# Patient Record
Sex: Male | Born: 1980 | Hispanic: Yes | Marital: Married | Smoking: Never smoker
Health system: Southern US, Community
[De-identification: ages and names within clinical notes are randomized; demographics above are authoritative.]

---

## 2013-11-10 ENCOUNTER — Encounter (HOSPITAL_COMMUNITY): Payer: Self-pay | Admitting: Emergency Medicine

## 2013-11-10 ENCOUNTER — Emergency Department (HOSPITAL_COMMUNITY)
Admission: EM | Admit: 2013-11-10 | Discharge: 2013-11-11 | Disposition: A | Discharge status: Home / Self Care | Payer: Worker's Compensation | Attending: Emergency Medicine | Admitting: Emergency Medicine

## 2013-11-10 ENCOUNTER — Emergency Department (HOSPITAL_COMMUNITY): Payer: Worker's Compensation

## 2013-11-10 DIAGNOSIS — Y929 Unspecified place or not applicable: Secondary | ICD-10-CM | POA: Insufficient documentation

## 2013-11-10 DIAGNOSIS — Z792 Long term (current) use of antibiotics: Secondary | ICD-10-CM | POA: Insufficient documentation

## 2013-11-10 DIAGNOSIS — S61209A Unspecified open wound of unspecified finger without damage to nail, initial encounter: Secondary | ICD-10-CM | POA: Insufficient documentation

## 2013-11-10 DIAGNOSIS — Y990 Civilian activity done for income or pay: Secondary | ICD-10-CM | POA: Insufficient documentation

## 2013-11-10 DIAGNOSIS — Z23 Encounter for immunization: Secondary | ICD-10-CM | POA: Insufficient documentation

## 2013-11-10 DIAGNOSIS — Y939 Activity, unspecified: Secondary | ICD-10-CM | POA: Insufficient documentation

## 2013-11-10 DIAGNOSIS — W230XXA Caught, crushed, jammed, or pinched between moving objects, initial encounter: Secondary | ICD-10-CM | POA: Insufficient documentation

## 2013-11-10 DIAGNOSIS — IMO0002 Reserved for concepts with insufficient information to code with codable children: Secondary | ICD-10-CM

## 2013-11-10 NOTE — ED Notes (Signed)
Patient here with complaint of left 3rd and 4th finger injury. 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 Explains that fingers are cut from edge of pipe.

## 2013-11-10 NOTE — ED Provider Notes (Signed)
CSN: [id number]     Arrival date & time 11/10/13  2024 History  This chart was scribed for Alan SilkHannah Laterrance Nauta, PA-C working with No att. providers found by Evon Slackerrance Branch, ED Scribe. This patient was seen in room TR09C/TR09C and the patient's care was started at 8:55 PM.    Chief Complaint  Patient presents with  . Finger Injury   The history is provided by the patient. No language interpreter was used.   HPI Comments: Alan Daugherty is a 33 y.o. right hand dominant male who presents to the Emergency Department complaining of left hand finger injury onset 7:45PM. He has an injury to the 3rd and 4th digit on his left hand. 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 Patient states he is in very little pain. He can move his finger without issues. He denies any other injuries.  He is unsure of last tetanus shot.   History reviewed. No pertinent past medical history. History reviewed. No pertinent past surgical history.   No family history on file. History  Substance Use Topics  . Smoking status: Never Smoker   . Smokeless tobacco: Not on file  . Alcohol Use: Yes     Comment: Occasional     Review of Systems  Skin: Positive for wound.  Neurological: Negative for weakness and numbness.  All other systems reviewed and are negative.     Allergies  Review of patient's allergies indicates no known allergies.  Home Medications   Prior to Admission medications   Medication Sig Start Date End Date Taking? Authorizing Provider  cephALEXin (KEFLEX) 500 MG capsule Take 1 capsule (500 mg total) by mouth 4 (four) times daily. 11/11/13   Mora BellmanHannah S Fitz Matsuo, PA-C   Triage Vitals: BP 119/69  Pulse 99  Temp(Src) 97.9 F (36.6 C) (Oral)  Resp 18  Ht 5\' 11"  (1.803 m)  Wt 223 lb 11.2 oz (101.47 kg)  BMI 31.21 kg/m2  SpO2 98%  Physical Exam  Nursing note and vitals reviewed. Constitutional: He is oriented to person, place, and time. He appears  well-developed and well-nourished. No distress.  HENT:  Head: Normocephalic and atraumatic.  Right Ear: External ear normal.  Left Ear: External ear normal.  Nose: Nose normal.  Eyes: Conjunctivae are normal.  Neck: Normal range of motion. No tracheal deviation present.  Cardiovascular: Normal rate, regular rhythm and normal heart sounds.   Pulmonary/Chest: Effort normal and breath sounds normal. No stridor.  Abdominal: Soft. He exhibits no distension. There is no tenderness.  Musculoskeletal: Normal range of motion.  Left hand: 2cm laceration to distal aspect on 3rd finger. Flexion and extension tested against resistance, strength 5/5 Sensation intact.  Capillary refill < 3 seconds   Neurological: He is alert and oriented to person, place, and time.  Skin: Skin is warm and dry. He is not diaphoretic.  Psychiatric: He has a normal mood and affect. His behavior is normal.    ED Course  Procedures (including critical care time) DIAGNOSTIC STUDIES: Oxygen Saturation is 98% on RA, normal by my interpretation.    COORDINATION OF CARE:  Labs Review Labs Reviewed - No data to display  Imaging Review Dg Hand Complete Left  11/10/2013   CLINICAL DATA:  Left hand injury.  EXAM: LEFT HAND - COMPLETE 3+ VIEW  COMPARISON:  None.  FINDINGS: There is a ring on the fourth finger. A bandage over the distal aspect of the fourth finger. There is  a bandage over the mid and distal aspect of the third finger. Lateral view demonstrates a soft tissue laceration involving the distal aspect of the third finger. Negative for fracture or dislocation.  IMPRESSION: No acute bone abnormality to the left hand.  Soft tissue injuries involving the third and fourth digits.   Electronically Signed   By: Richarda OverlieAdam  Henn M.D.   On: 11/10/2013 21:27     EKG Interpretation None      LACERATION REPAIR Performed by: Alan SilkMerrell, [name] Authorized by: Alan SilkMerrell, [name] Consent: Verbal consent obtained. Risks and benefits:  risks, benefits and alternatives were discussed Consent given by: patient Patient identity confirmed: provided demographic data Prepped and Draped in normal sterile fashion Wound explored  Laceration Location: 3rd finger  Laceration Length: 2 cm  No Foreign Bodies seen or palpated  Anesthesia: digital block  Local anesthetic: lidocaine 2%   Anesthetic total: 3 ml  Irrigation method: syringe Amount of cleaning: standard  Skin closure: 5-0 Ethilon  Number of sutures: 4  Technique: simple interrupted   Patient tolerance: Patient tolerated the procedure well with no immediate complications.   MDM   Final diagnoses:  Laceration    Tdap booster given. Wound cleaning complete with pressure irrigation, bottom of wound visualized, no foreign bodies appreciated. Laceration occurred < 8 hours prior to repair which was well tolerated. Pt has no co morbidities to effect normal wound healing. Discussed suture home care w pt and answered questions. Pt to f-u for wound check and suture removal in 7 days. Pt is hemodynamically stable w no complaints prior to dc.   I personally performed the services described in this documentation, which was scribed in my presence. The recorded information has been reviewed and is accurate.    Mora BellmanHannah S [name], PA-C 11/12/13 1538

## 2013-11-10 NOTE — ED Notes (Signed)
Patient transported to X-ray 

## 2013-11-11 MED ORDER — CEPHALEXIN 500 MG PO CAPS: 500.0000 mg | ORAL_CAPSULE | Freq: Four times a day (QID) | ORAL | Status: DC

## 2013-11-11 MED ORDER — TETANUS-DIPHTH-ACELL PERTUSSIS 5-2.5-18.5 LF-MCG/0.5 IM SUSP
0.5000 mL | Freq: Once | INTRAMUSCULAR | Status: AC
  Administered 2013-11-11: 0.5 mL via INTRAMUSCULAR
  Filled 2013-11-11: qty 0.5

## 2013-11-11 NOTE — Discharge Instructions (Signed)
Cuidados de una laceración - Adultos  °(Laceration Care, Adult) ° Una laceración es un corte que atraviesa todas las capas de la piel. El corte llega hasta las capas inferiores de la piel.  °CUIDADOS EN EL HOGAR  °Si tiene puntos (suturas) o grapas:  °· Mantenga el corte limpio y seco. °· Si tiene un (vendaje) cámbielo al menos una vez al día. Cámbielo si se moja o se ensucia, o según las indicaciones del médico. °· Lave el corte dos veces por día con agua y jabón. Enjuágelo con agua. Seque dando palmaditas con un paño limpio y seco. °· Aplique una capa delgada de crema con medicamento sobre el corte, según las indicaciones del médico. °· Puede ducharse después de las primeras 24 horas. No remoje la herida en agua hasta que le hayan quitado los puntos. °· Tome sólo los medicamentos que le haya indicado el médico. °· Concurra para que le retiren los puntos cuando el médico le indique. °En caso que tenga tiras adhesivas:  °· Mantenga la herida limpia y seca. °· No deje que las tiras se mojen. Puede tomar un baño, pero tenga cuidado de no mojar el corte. °· Si se moja, séquelo dando palmaditas con una toalla limpia. °· Las tiras caerán por sí mismas. No quite las tiras que están pegadas a la herida. °En caso que le hayan aplicado adhesivo.  °· Puede ducharse o tomar un baño de inmersión. No frote ni sumerja la herida. Nopractique natación. Evite transpirar mucho hasta que el adhesivo desaparezca. Después de ducharse o darse un baño, seque el corte dando palmaditas con una toalla limpia. °· No aplique medicamentos ni maquillaje hasta que el adhesivo caiga. °· Si tiene un vendaje, No peque cinta adhesiva sobre el adhesivo °· Evite la luz solar o las lámparas para bronceado hasta que el adhesivo desaparezca. Aplique pantalla solar sobre el corte durante el primer año, para reducir la cicatriz. °· El adhesivo caerá por sí mismo. No quite el pegamento. °Deberá aplicarse la vacuna contra el tétanos si: °· No recuerda cuándo  se colocó la vacuna la última vez. °· Nunca recibió esta vacuna. °Si usted necesita aplicarse la vacuna y se niega a recibirla, corre riesgo de contraer tétanos. Ésta es una enfermedad grave.  °SOLICITE AYUDA DE INMEDIATO SI:  °· El dolor no mejora con los medicamentos prescriptos. °· Pierde la sensibilidad (adormecimiento) en el brazo, la mano, la pierna o el pie u observa cambios en el color. °· El corte sangra. °· Siente debilidad en la articulación o no puede usarla. °· Tiene bultos que le duelen en el cuerpo. °· La zona del corte está roja, le duele o estáhinchada. °· Hay una línea roja en la piel, cerca del corte. °· Observa un líquido blanco amarillento (pus) en la herida. °· Tiene fiebre. °· Advierte un olor fétido que proviene de la herida o del vendaje. °· La herida se abre antes o después de que le hayan quitado los puntos. °· Nota que en la herida hay algún cuerpo extraño como un trozo de madera o vidrio. °· No puede mover los dedos. °ASEGÚRESE DE QUE:  °· Comprende estas instrucciones. °· Controlará su enfermedad. °· Solicitará ayuda de inmediato si no mejora o si empeora. °Document Released: 12/19/2010 Document Revised: 07/15/2011 °ExitCare® Patient Information ©2015 [employer]. This information is not intended to replace advice given to you by your health care provider. Make sure you discuss any questions you have with your health care provider. ° °

## 2013-11-13 NOTE — ED Provider Notes (Signed)
Medical screening examination/treatment/procedure(s) were performed by non-physician practitioner and as supervising physician I was immediately available for consultation/collaboration.   Ria Redcay L Mckinzi Eriksen, MD 11/13/13 1304 

## 2015-05-05 IMAGING — CR DG HAND COMPLETE 3+V*L*
3 series · 3 of 3 positions shown · non-contrast
Comparison: None.

CLINICAL DATA: Left hand injury.

EXAM:
LEFT HAND - COMPLETE 3+ VIEW

[x hand pa left]
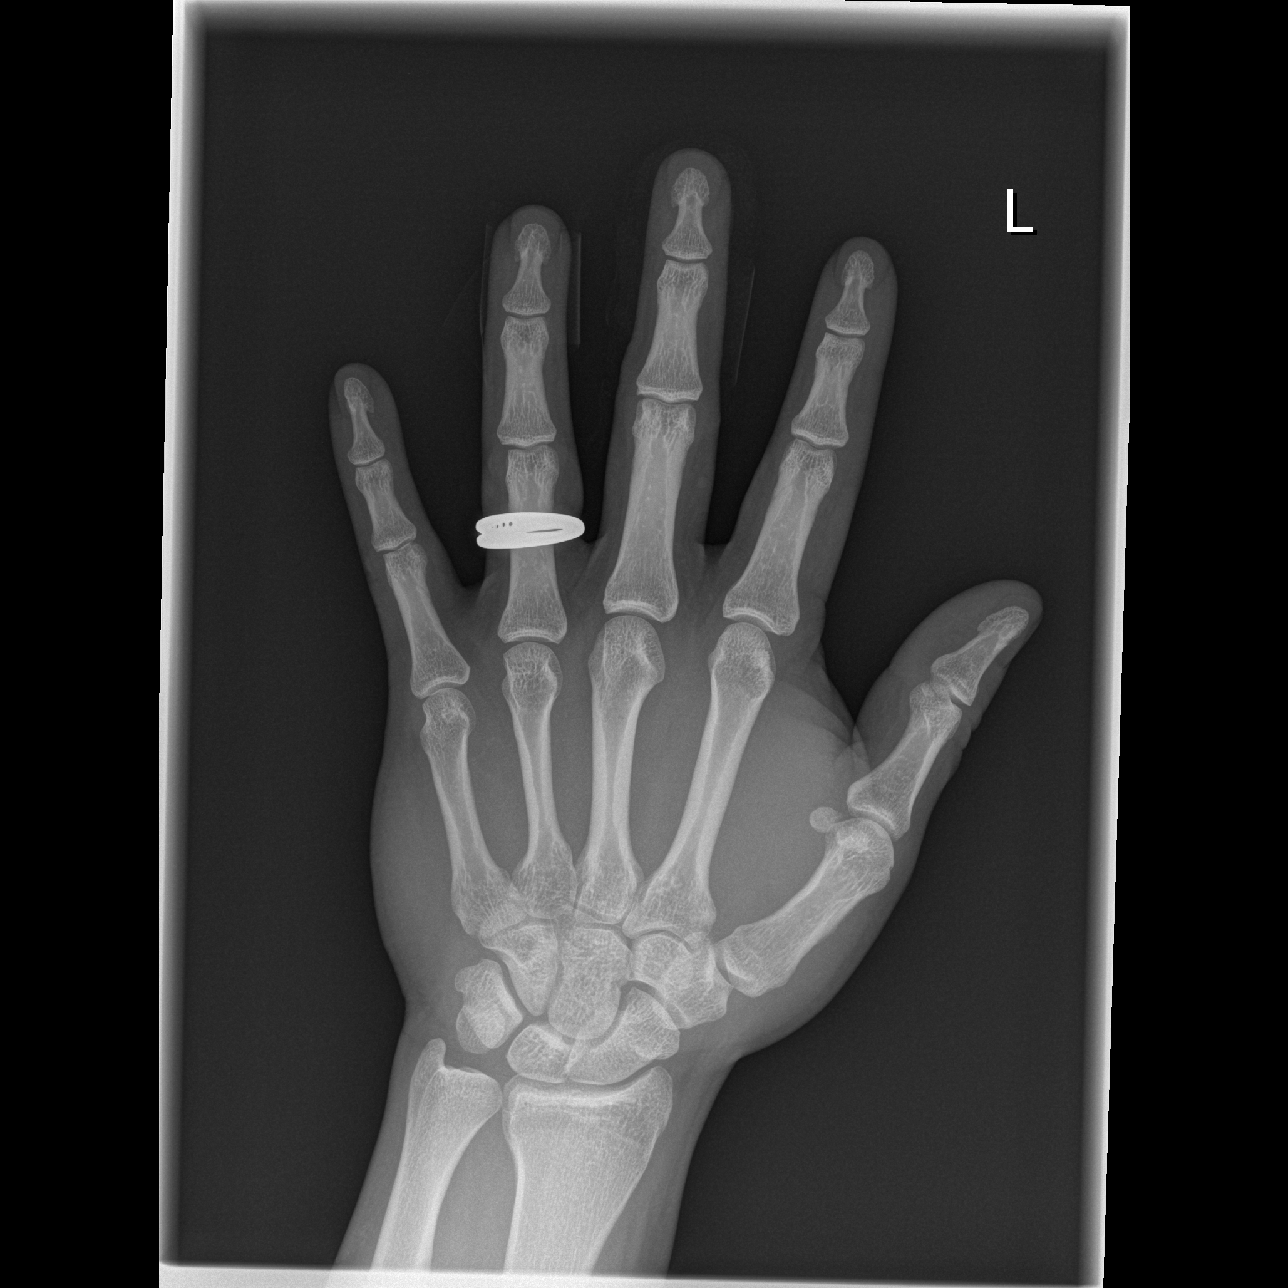

[x hand oblique left]
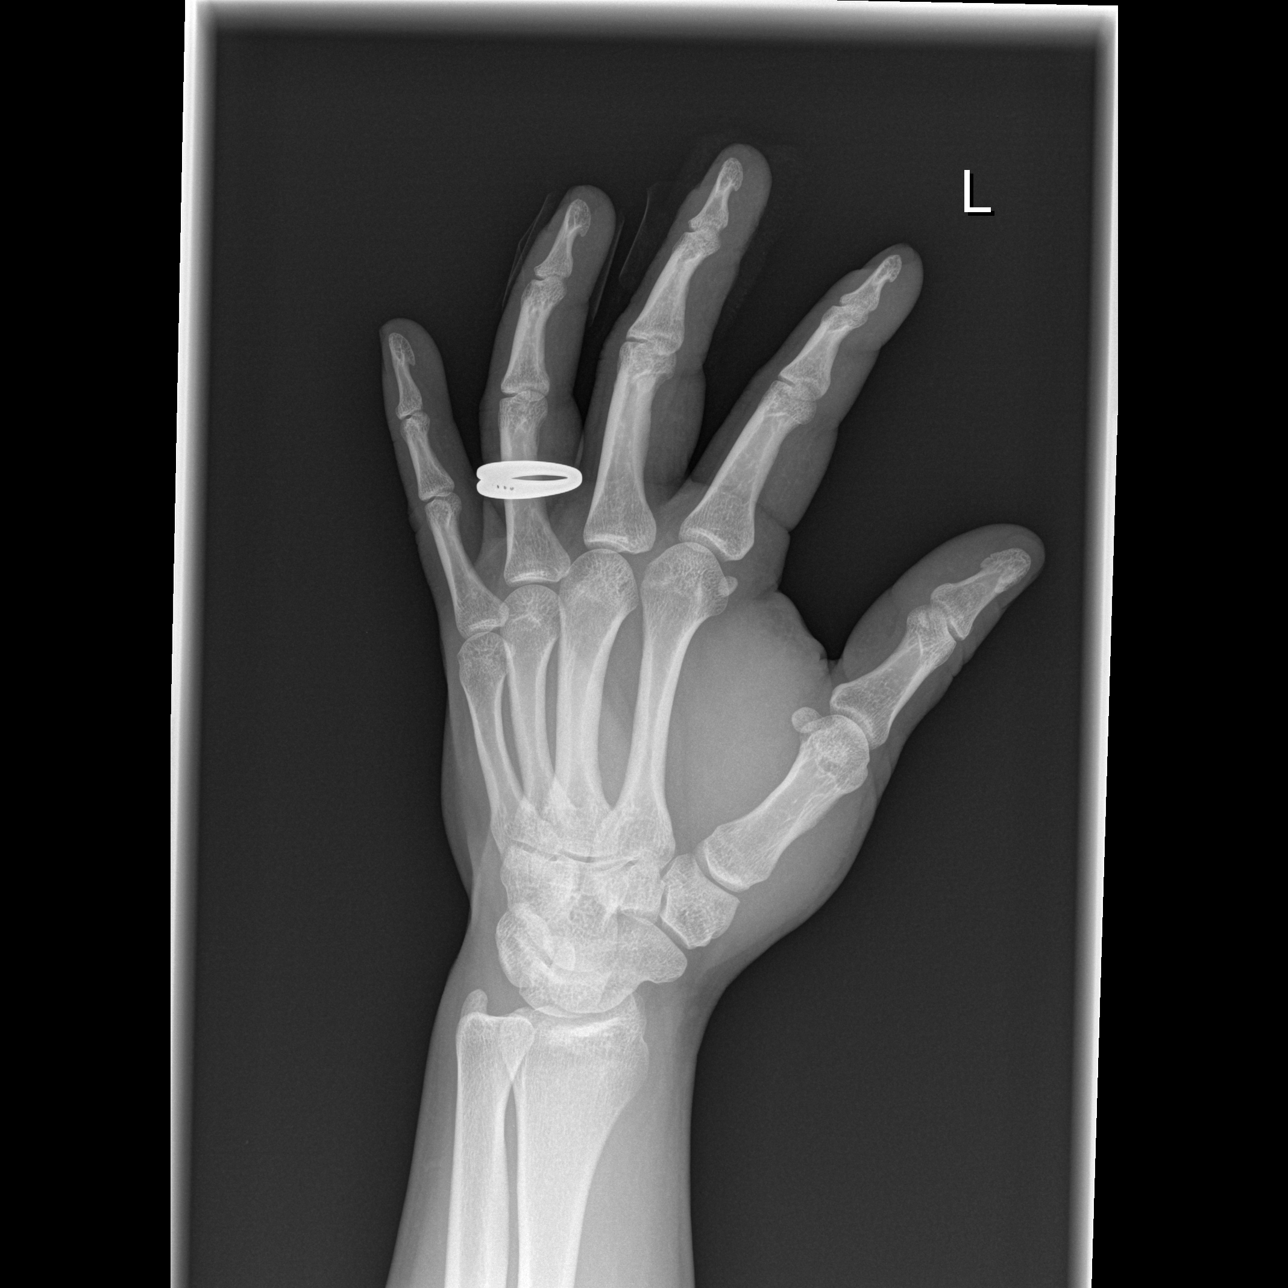

[x hand lat left *]
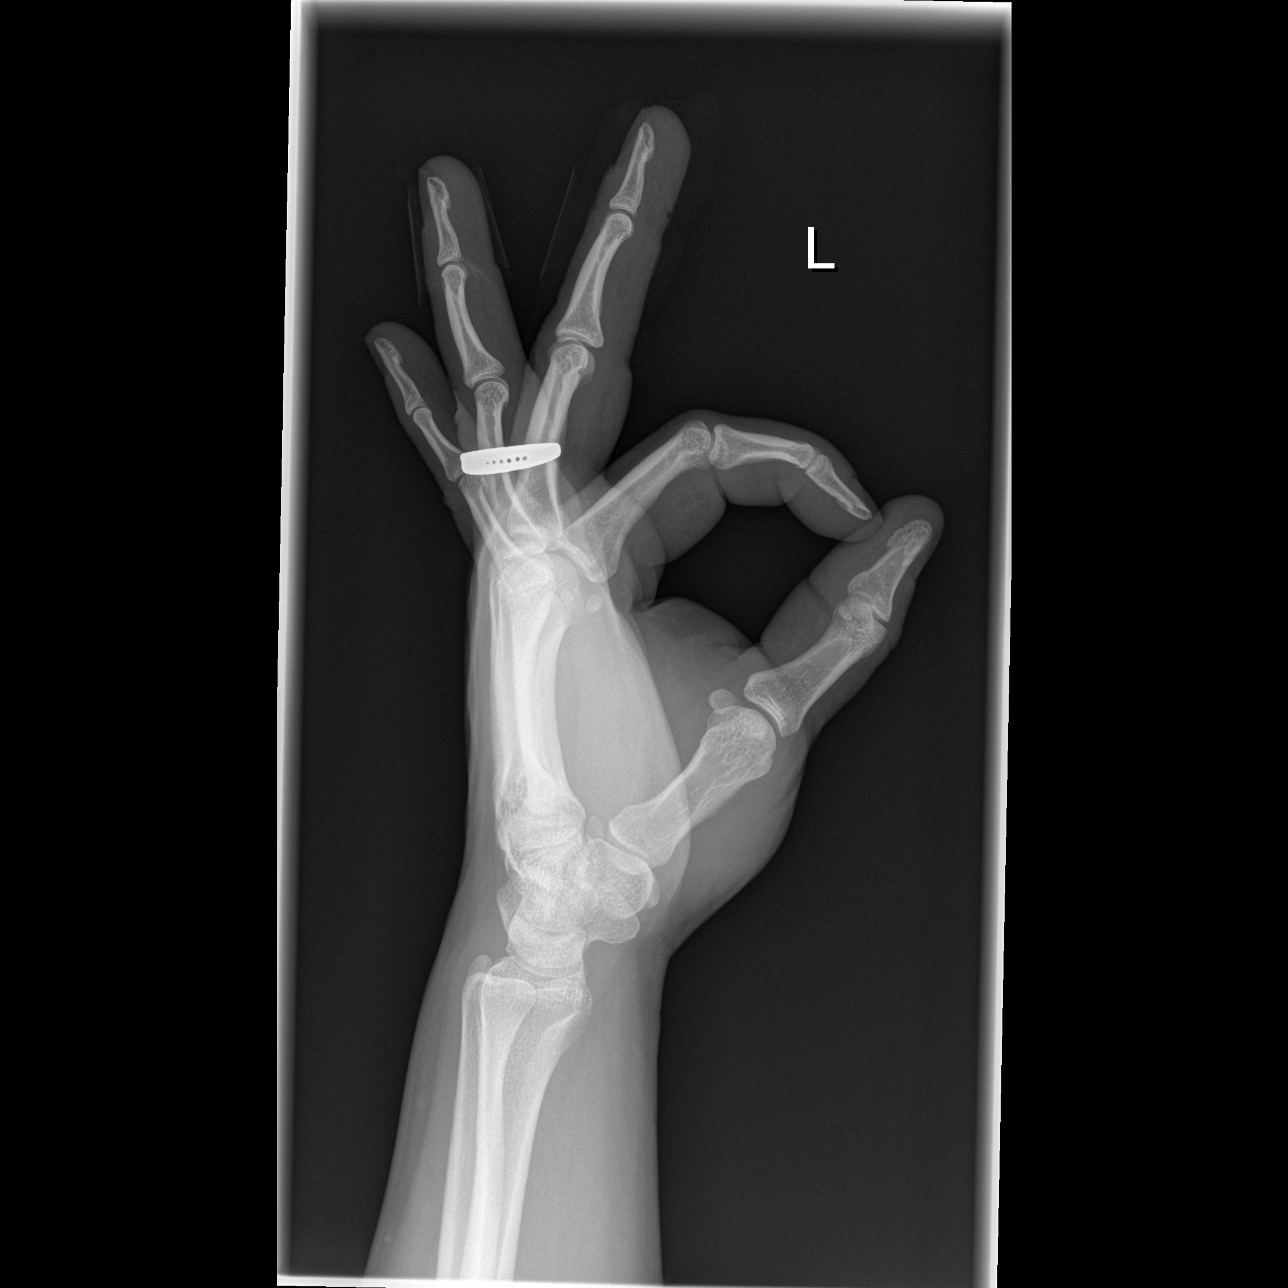

[3 of 3 positions shown; findings below may reference images not displayed]

FINDINGS: There is a ring on the fourth finger. A bandage over the distal
aspect of the fourth finger. There is a bandage over the mid and
distal aspect of the third finger. Lateral view demonstrates a soft
tissue laceration involving the distal aspect of the third finger.
Negative for fracture or dislocation.
IMPRESSION: No acute bone abnormality to the left hand.

Soft tissue injuries involving the third and fourth digits.

## 2015-05-27 ENCOUNTER — Encounter (HOSPITAL_COMMUNITY): Payer: Self-pay | Admitting: *Deleted

## 2015-05-27 ENCOUNTER — Emergency Department (HOSPITAL_COMMUNITY)
Admission: EM | Admit: 2015-05-27 | Discharge: 2015-05-27 | Disposition: A | Discharge status: Home / Self Care | Payer: Commercial Managed Care - HMO | Source: Home / Self Care | Attending: Internal Medicine | Admitting: Internal Medicine

## 2015-05-27 ENCOUNTER — Other Ambulatory Visit (HOSPITAL_COMMUNITY)
Admission: RE | Admit: 2015-05-27 | Discharge: 2015-05-27 | Disposition: A | Discharge status: Home / Self Care | Payer: Commercial Managed Care - HMO | Source: Ambulatory Visit | Attending: Internal Medicine | Admitting: Internal Medicine

## 2015-05-27 DIAGNOSIS — J029 Acute pharyngitis, unspecified: Secondary | ICD-10-CM | POA: Insufficient documentation

## 2015-05-27 LAB — POCT RAPID STREP A: Streptococcus, Group A Screen (Direct): NEGATIVE

## 2015-05-27 NOTE — ED Provider Notes (Signed)
CSN: [id number]     Arrival date & time 05/27/15  4098 History   None    Chief Complaint  Patient presents with  . Sore Throat   HPI Patient is a 35 year old gentleman who presents today with a 5 or 6 day history of runny nose and cough, now with 3 days of increasing sore throat. Hurts very much to swallow, particularly on the left. Fever, no headache, no achiness, but says that he feels terrible, malaise. No vomiting, no diarrhea. Appetite is poor. Wife was recently diagnosed with the flu.  History reviewed. No pertinent past medical history. History reviewed. No pertinent past surgical history. History reviewed. No pertinent family history. Social History  Substance Use Topics  . Smoking status: Never Smoker   . Smokeless tobacco: None  . Alcohol Use: Yes     Comment: Occasional     Review of Systems  All other systems reviewed and are negative.   Allergies  Review of patient's allergies indicates no known allergies.  Home Medications   Prior to Admission medications   Medication Sig Start Date End Date Taking? Authorizing Provider  cephALEXin (KEFLEX) 500 MG capsule Take 1 capsule (500 mg total) by mouth 4 (four) times daily. 11/11/13   [name], PA-C   Meds Ordered and Administered this Visit  Medications - No data to display  BP 120/79 mmHg  Pulse 83  Temp(Src) 98.3 F (36.8 C) (Oral)  SpO2 99% No data found.   Physical Exam  Constitutional: He is oriented to person, place, and time. No distress.  Alert, nicely groomed Voice sounds quite congested Sitting up on the end of the exam table, looks ill but not toxic  HENT:  Head: Atraumatic.  Bilateral TMs are quite dull, no erythema Moderate to marked nasal congestion bilaterally, with mucopurulent material present Throat is red with big red tonsils, with scant exudate  Eyes:  Conjugate gaze, no eye redness/drainage  Neck: Neck supple.  Cardiovascular: Normal rate and regular rhythm.   Pulmonary/Chest:  No respiratory distress. He has no wheezes. He has no rales.  Lungs clear, symmetric breath sounds  Abdominal: He exhibits no distension.  Musculoskeletal: Normal range of motion.  Neurological: He is alert and oriented to person, place, and time.  Skin: Skin is warm and dry.  No cyanosis  Nursing note and vitals reviewed.   ED Course  Procedures (including critical care time)   Results for orders placed or performed during the hospital encounter of 05/27/15  POCT rapid strep A [facility])  Result Value Ref Range   Streptococcus, Group A Screen (Direct) NEGATIVE NEGATIVE     MDM   1. Pharyngitis    Await throat culture results. Recheck for new fever >100.5 or if not starting to improve in a few days. Push fluids, rest. Pt declined work note today.    Eustace Moore, MD 05/27/15 [phone]

## 2015-05-27 NOTE — Discharge Instructions (Signed)
Strep swab was negative today; throat culture result should be available in 2-3 days. Rest, push fluids.  Recheck for new fever >100.5, drooling, or if not starting to improve in a few days. Take advil or aleve for bad sore throat.  Faringitis (Pharyngitis) La faringitis ocurre cuando la faringe presenta enrojecimiento, dolor e hinchazn (inflamacin).  CAUSAS  Normalmente, la faringitis se debe a una infeccin. Generalmente, estas infecciones ocurren debido a virus (viral) y se presentan cuando las personas se resfran. Sin embargo, a Advertising account executive faringitis es provocada por bacterias (bacteriana). Las alergias tambin pueden ser una causa de la faringitis. La faringitis viral se puede contagiar de Neomia Dear persona a otra al toser, estornudar y compartir objetos o utensilios personales (tazas, tenedores, cucharas, cepillos de diente). La faringitis bacteriana se puede contagiar de Neomia Dear persona a otra a travs de un contacto ms ntimo, como besar.  SIGNOS Y SNTOMAS  Los sntomas de la faringitis incluyen los siguientes:   Dolor de Advertising copywriter.  Cansancio (fatiga).  Fiebre no muy elevada.  Dolor de [place].  Dolores musculares y en las articulaciones.  Erupciones cutneas  Ganglios linfticos hinchados.  Una pelcula parecida a las placas en la garganta o las amgdalas (frecuente con la faringitis bacteriana). DIAGNSTICO  El mdico le har preguntas sobre la enfermedad y sus sntomas. Normalmente, todo lo que se necesita para diagnosticar una faringitis son sus antecedentes mdicos y un examen fsico. A veces se realiza una prueba rpida para estreptococos. Tambin es posible que se realicen otros anlisis de laboratorio, segn la posible causa.  TRATAMIENTO  La faringitis viral normalmente mejorar en un plazo de 3 a 4das sin medicamentos. La faringitis bacteriana se trata con medicamentos que [employer] grmenes (antibiticos).  INSTRUCCIONES PARA EL CUIDADO EN EL HOGAR   Beba gran cantidad  de lquido para mantener la orina de tono claro o color amarillo plido.  Tome solo medicamentos de venta libre o recetados, segn las indicaciones del mdico.  Si le receta antibiticos, asegrese de terminarlos, incluso si comienza a Actor.  No tome aspirina.  Descanse lo suficiente.  Hgase grgaras con 8onzas ( ) de agua con sal (cucharadita de sal por litro de agua) cada 1 o 2horas para Science writer.  Puede usar pastillas (si no corre riesgo de Health visitor) o aerosoles para Science writer. SOLICITE ATENCIN MDICA SI:   Tiene bultos grandes y dolorosos en el cuello.  Tiene una erupcin cutnea.  Cuando tose elimina una expectoracin verde, amarillo amarronado o con [place]. SOLICITE ATENCIN MDICA DE INMEDIATO SI:   El cuello se pone rgido.  Comienza a babear o no puede tragar lquidos.  Vomita o no puede retener los [employer] lquidos.  Siente un dolor intenso que no se alivia con los medicamentos recomendados.  Tiene dificultades para respirar (y no debido a la nariz tapada). ASEGRESE DE QUE:   Comprende estas instrucciones.  Controlar su afeccin.  Recibir ayuda de inmediato si no mejora o si empeora.   Esta informacin no tiene Theme park manager el consejo del mdico. Asegrese de hacerle al mdico cualquier pregunta que tenga.   Document Released: 01/30/2005 Document Revised: 02/10/2013 Elsevier Interactive Patient Education [employer]

## 2015-05-27 NOTE — ED Notes (Signed)
Pt   Reports        sorethroat        X  3  Days  Pain   When  He  Swallows            he  Reports  Symptoms  Of   Alan Daugherty     Prior  To sorethroat

## 2015-05-30 ENCOUNTER — Telehealth (HOSPITAL_COMMUNITY): Payer: Self-pay | Admitting: Internal Medicine

## 2015-05-30 DIAGNOSIS — J020 Streptococcal pharyngitis: Secondary | ICD-10-CM

## 2015-05-30 LAB — CULTURE, GROUP A STREP (THRC)

## 2015-05-30 MED ORDER — PENICILLIN V POTASSIUM 500 MG PO TABS: 500.0000 mg | ORAL_TABLET | Freq: Two times a day (BID) | ORAL | Status: DC

## 2015-05-30 NOTE — ED Notes (Signed)
Throat culture growing strep.  Rx penicillin sent to [facility] at [address].  Please let patient know.  LM  Eustace Moore, MD 05/30/15 1900

## 2019-11-28 ENCOUNTER — Other Ambulatory Visit: Payer: Self-pay

## 2019-11-28 ENCOUNTER — Encounter (HOSPITAL_COMMUNITY): Payer: Self-pay | Admitting: Emergency Medicine

## 2019-11-28 ENCOUNTER — Ambulatory Visit (HOSPITAL_COMMUNITY)
Admission: EM | Admit: 2019-11-28 | Discharge: 2019-11-28 | Disposition: A | Discharge status: Home / Self Care | Payer: 59

## 2019-11-28 DIAGNOSIS — R519 Headache, unspecified: Secondary | ICD-10-CM | POA: Diagnosis not present

## 2019-11-28 DIAGNOSIS — J3489 Other specified disorders of nose and nasal sinuses: Secondary | ICD-10-CM

## 2019-11-28 MED ORDER — CETIRIZINE HCL 10 MG PO TABS: 10.0000 mg | ORAL_TABLET | Freq: Every day | ORAL | 0 refills | Status: AC

## 2019-11-28 MED ORDER — PREDNISONE 20 MG PO TABS: 40.0000 mg | ORAL_TABLET | Freq: Every day | ORAL | 0 refills | Status: AC

## 2019-11-28 NOTE — Discharge Instructions (Signed)
Push fluids to ensure adequate hydration and keep secretions thin.  Daily zyrtec. 3 days of prednisone.  You may continue with tylenol as well to help with pain.  If symptoms worsen or do not improve in the next week to return to be seen or to follow up with your PCP.

## 2019-11-28 NOTE — ED Triage Notes (Addendum)
Headache, pressure in forehead that started Saturday.  Denies fever, no sore throat.   [facility] and has negative covid result

## 2019-11-29 NOTE — ED Provider Notes (Signed)
[facility]    CSN: [id number] Arrival date & time: 11/28/19  1737      History   Chief Complaint Chief Complaint  Patient presents with  . Headache    HPI Alan Daugherty is a 39 y.o. male.   Alan Daugherty presents with complaints of facial pressure, primarily frontal and between eyes, which started yesterday morning. Tylenol and theraflu have been helpful. Some scratchy throat. No specific nasal drainage. No cough or fevers. Has felt some chills. Went to [facility] and had a negative rapid covid test. No gi symptoms. No body aches. Denies any previous similar. No chest pain. No dizziness. He feels better today than he did yesterday but states he feels a "hangover" type sensation.    ROS per HPI, negative if not otherwise mentioned.      History reviewed. No pertinent past medical history.  There are no problems to display for this patient.   History reviewed. No pertinent surgical history.     Home Medications    Prior to Admission medications   Medication Sig Start Date End Date Taking? Authorizing Provider  acetaminophen (TYLENOL) 325 MG tablet Take 650 mg by mouth every 6 (six) hours as needed.   Yes [provider]  Phenylephrine-Pheniramine-DM [facility] COLD & COUGH PO) Take by mouth.   Yes [provider]  cephALEXin (KEFLEX) 500 MG capsule Take 1 capsule (500 mg total) by mouth 4 (four) times daily. 11/11/13   Alan Silk, PA-C  cetirizine (ZYRTEC) 10 MG tablet Take 1 tablet (10 mg total) by mouth daily. 11/28/19   Alan Haber, NP  penicillin v potassium (VEETID) 500 MG tablet Take 1 tablet (500 mg total) by mouth 2 (two) times daily. 05/30/15   Alan Rankin, MD  predniSONE (DELTASONE) 20 MG tablet Take 2 tablets (40 mg total) by mouth daily with breakfast for 3 days. 11/28/19 12/01/19  Alan Haber, NP    Family History Family History  Problem Relation Age of Onset  . Healthy Mother   . Healthy Father       Social History Social History   Tobacco Use  . Smoking status: Never Smoker  Substance Use Topics  . Alcohol use: Yes    Comment: Occasional   . Drug use: No     Allergies   Patient has no known allergies.   Review of Systems Review of Systems   Physical Exam Triage Vital Signs ED Triage Vitals  Enc Vitals Group     BP 11/28/19 1801 115/77     Pulse Rate 11/28/19 1801 98     Resp 11/28/19 1801 20     Temp 11/28/19 1801 99.8 F (37.7 C)     Temp Source 11/28/19 1801 Oral     SpO2 11/28/19 1801 100 %     Weight --      Height --      Head Circumference --      Peak Flow --      Pain Score 11/28/19 1757 7     Pain Loc --      Pain Edu? --      Excl. in GC? --    No data found.  Updated Vital Signs BP 115/77 (BP Location: Right Arm) Comment (BP Location): large cuff  Pulse 98   Temp 99.8 F (37.7 C) (Oral)   Resp 20   SpO2 100%   Visual Acuity Right Eye Distance:   Left Eye Distance:   Bilateral Distance:  Right Eye Near:   Left Eye Near:    Bilateral Near:     Physical Exam Constitutional:      Appearance: He is well-developed.  HENT:     Nose:     Right Sinus: Frontal sinus tenderness present.     Left Sinus: Frontal sinus tenderness present.     Comments: Very mild frontal sinus pressure on palpation  Eyes:     Extraocular Movements: Extraocular movements intact.     Pupils: Pupils are equal, round, and reactive to light.  Cardiovascular:     Rate and Rhythm: Normal rate.  Pulmonary:     Effort: Pulmonary effort is normal.  Musculoskeletal:     Cervical back: Normal range of motion. No rigidity.  Skin:    General: Skin is warm and dry.  Neurological:     Mental Status: He is alert and oriented to person, place, and time.     Cranial Nerves: No cranial nerve deficit or facial asymmetry.     Sensory: No sensory deficit.     Motor: No weakness.  Psychiatric:        Mood and Affect: Mood normal.        Speech: Speech normal.       UC Treatments / Results  Labs (all labs ordered are listed, but only abnormal results are displayed) Labs Reviewed - No data to display  EKG   Radiology No results found.  Procedures Procedures (including critical care time)  Medications Ordered in UC Medications - No data to display  Initial Impression / Assessment and Plan / UC Course  I have reviewed the triage vital signs and the nursing notes.  Pertinent labs & imaging results that were available during my care of the patient were reviewed by me and considered in my medical decision making (see chart for details).     improving headache, sinus pressure, started yesterday. No red flag findings. Supportive cares recommended. Return precautions provided. Patient verbalized understanding and agreeable to plan.   Final Clinical Impressions(s) / UC Diagnoses   Final diagnoses:  Acute nonintractable headache, unspecified headache type  Sinus pressure     Discharge Instructions     Push fluids to ensure adequate hydration and keep secretions thin.  Daily zyrtec. 3 days of prednisone.  You may continue with tylenol as well to help with pain.  If symptoms worsen or do not improve in the next week to return to be seen or to follow up with your PCP.      ED Prescriptions    Medication Sig Dispense Auth. Provider   cetirizine (ZYRTEC) 10 MG tablet Take 1 tablet (10 mg total) by mouth daily. 30 tablet Alan Daugherty B, NP   predniSONE (DELTASONE) 20 MG tablet Take 2 tablets (40 mg total) by mouth daily with breakfast for 3 days. 6 tablet Alan Haber, NP     PDMP not reviewed this encounter.   Alan Haber, NP 11/29/19 [phone]

## 2019-12-15 ENCOUNTER — Other Ambulatory Visit: Payer: Self-pay

## 2019-12-15 ENCOUNTER — Ambulatory Visit (HOSPITAL_COMMUNITY)
Admission: EM | Admit: 2019-12-15 | Discharge: 2019-12-15 | Disposition: A | Discharge status: Home / Self Care | Payer: 59

## 2019-12-16 ENCOUNTER — Ambulatory Visit: Payer: 59

## 2019-12-16 ENCOUNTER — Ambulatory Visit: Payer: 59 | Admitting: Physician Assistant

## 2019-12-16 ENCOUNTER — Other Ambulatory Visit: Payer: Self-pay

## 2019-12-16 VITALS — BP 111/70 | HR 88 | Resp 20 | Wt 207.0 lb

## 2019-12-16 DIAGNOSIS — R5383 Other fatigue: Secondary | ICD-10-CM | POA: Diagnosis not present

## 2019-12-16 DIAGNOSIS — Z13220 Encounter for screening for lipoid disorders: Secondary | ICD-10-CM | POA: Diagnosis not present

## 2019-12-16 DIAGNOSIS — R0602 Shortness of breath: Secondary | ICD-10-CM | POA: Diagnosis not present

## 2019-12-16 NOTE — Progress Notes (Signed)
New Patient Office Visit  Subjective:  Patient ID: Alan Daugherty, male    DOB: [birth year]  Age: 39 y.o. MRN: [id number]  CC:  Chief Complaint  Patient presents with  . Follow-up    HPI Alan Daugherty reports that he started feeling poorly on July 25, states that he went to urgent care and was given medication for a sinus infection.  Reports that he had tested negative for COVID-19 at that time.  Reports that he continued to feel poorly, endorses feeling worse the next few days, states then his children started feeling poorly, were taken to their pediatrician where they did test positive for Covid.  Reports that he continues to feel fatigued, and has been having shortness of breath on exertion.  Otherwise rest of symptoms have resolved.  Reports that he works outside in the heat and does not feel he would be able to perform properly.  Has not tried anything for relief, no history of asthma or tobacco abuse   History reviewed. No pertinent past medical history.  History reviewed. No pertinent surgical history.  Family History  Problem Relation Age of Onset  . Healthy Mother   . Healthy Father     Social History   Socioeconomic History  . Marital status: Married    Spouse name: Not on file  . Number of children: Not on file  . Years of education: Not on file  . Highest education level: Not on file  Occupational History  . Not on file  Tobacco Use  . Smoking status: Never Smoker  . Smokeless tobacco: Never Used  Substance and Sexual Activity  . Alcohol use: Yes    Comment: Occasional   . Drug use: No  . Sexual activity: Not on file  Other Topics Concern  . Not on file  Social History Narrative  . Not on file   Social Determinants of Health   Financial Resource Strain:   . Difficulty of Paying Living Expenses:   Food Insecurity:   . Worried About Programme researcher, broadcasting/film/video in the Last Year:   . Barista in the Last Year:   Transportation Needs:   . Sales promotion account executive (Medical):   Marland Kitchen Lack of Transportation (Non-Medical):   Physical Activity:   . Days of Exercise per Week:   . Minutes of Exercise per Session:   Stress:   . Feeling of Stress :   Social Connections:   . Frequency of Communication with Friends and Family:   . Frequency of Social Gatherings with Friends and Family:   . Attends Religious Services:   . Active Member of Clubs or Organizations:   . Attends Banker Meetings:   Marland Kitchen Marital Status:   Intimate Partner Violence:   . Fear of Current or Ex-Partner:   . Emotionally Abused:   Marland Kitchen Physically Abused:   . Sexually Abused:     ROS Review of Systems  Constitutional: Positive for fatigue. Negative for chills and fever.  HENT: Negative for congestion, sinus pressure, sinus pain, sneezing and sore throat.   Eyes: Negative.   Respiratory: Positive for shortness of breath. Negative for cough and wheezing.   Cardiovascular: Negative for chest pain.  Gastrointestinal: Negative for diarrhea, nausea and vomiting.  Endocrine: Negative.   Genitourinary: Negative.   Musculoskeletal: Negative.   Skin: Negative.   Allergic/Immunologic: Negative.   Neurological: Negative.   Hematological: Negative.   Psychiatric/Behavioral: Negative.     Objective:   Today's Vitals:  BP 111/70 (BP Location: Left Arm, Patient Position: Sitting, Cuff Size: Large)   Pulse 88   Resp 20   Wt 207 lb (93.9 kg)   SpO2 96%   BMI 28.87 kg/m   Physical Exam Vitals and nursing note reviewed.  Constitutional:      General: He is not in acute distress.    Appearance: Normal appearance. He is not ill-appearing.  HENT:     Head: Normocephalic and atraumatic.     Right Ear: Tympanic membrane, ear canal and external ear normal.     Left Ear: Tympanic membrane, ear canal and external ear normal.     Nose: Nose normal.     Mouth/Throat:     Mouth: Mucous membranes are moist.     Pharynx: Oropharynx is clear.  Eyes:     Extraocular  Movements: Extraocular movements intact.     Conjunctiva/sclera: Conjunctivae normal.     Pupils: Pupils are equal, round, and reactive to light.  Cardiovascular:     Rate and Rhythm: Normal rate and regular rhythm.     Pulses: Normal pulses.     Heart sounds: Normal heart sounds.  Pulmonary:     Effort: Pulmonary effort is normal.     Breath sounds: Normal breath sounds. No wheezing.  Abdominal:     General: Abdomen is flat. Bowel sounds are normal.     Palpations: Abdomen is soft.  Musculoskeletal:        General: Normal range of motion.     Cervical back: Normal range of motion and neck supple.  Lymphadenopathy:     Cervical: No cervical adenopathy.  Skin:    General: Skin is warm and dry.  Neurological:     General: No focal deficit present.     Mental Status: He is alert and oriented to person, place, and time.  Psychiatric:        Mood and Affect: Mood normal.        Behavior: Behavior normal.        Thought Content: Thought content normal.        Judgment: Judgment normal.     Assessment & Plan:   Problem List Items Addressed This Visit    None    Visit Diagnoses    Fatigue, unspecified type    -  Primary   Relevant Orders   CBC with Differential/Platelet   Comp. Metabolic Panel (12)   TSH   SOB (shortness of breath) on exertion       Screening, lipid       Relevant Orders   Lipid panel      Outpatient Encounter Medications as of 12/16/2019  Medication Sig  . acetaminophen (TYLENOL) 325 MG tablet Take 650 mg by mouth every 6 (six) hours as needed.  . cetirizine (ZYRTEC) 10 MG tablet Take 1 tablet (10 mg total) by mouth daily.  . [DISCONTINUED] cephALEXin (KEFLEX) 500 MG capsule Take 1 capsule (500 mg total) by mouth 4 (four) times daily.  . [DISCONTINUED] penicillin v potassium (VEETID) 500 MG tablet Take 1 tablet (500 mg total) by mouth 2 (two) times daily.  . [DISCONTINUED] Phenylephrine-Pheniramine-DM (THERAFLU COLD & COUGH PO) Take by mouth.   No  facility-administered encounter medications on file as of 12/16/2019.  1. Fatigue, unspecified type Encouraged proper rest, hydration, vitamin D over-the-counter.  FMLA papers were filled out upon patient request.  Patient to return to mobile unit next week for fasting labs and further evaluation  - CBC with Differential/Platelet -  Comp. Metabolic Panel (12) - TSH  2. SOB (shortness of breath) on exertion Patient given inhaler, given education on proper use, encouraged to use twice a day as needed   I have reviewed the patient's medical history (PMH, PSH, Social History, Family History, Medications, and allergies) , and have been updated if relevant. I spent 30 minutes reviewing chart and  face to face time with patient.      Follow-up: Return in about 6 days (around 12/22/2019).   Kasandra Knudsen Mayers, PA-C

## 2019-12-16 NOTE — Progress Notes (Signed)
Needs paperwork for FMLA  Completed July was exposed to Covid Has SOB when walks long distance

## 2019-12-16 NOTE — Patient Instructions (Signed)
I encourage you to continue getting plenty of rest, proper hydration.  I encourage you to take Zyrtec or Claritin over-the-counter on a daily basis, use the inhaler twice a day, work on deep breathing exercises.  I encourage you to take vitamin D 2000 units over-the-counter on a daily basis as well.  Please return to the mobile unit in 1 week for follow-up  Roney Jaffe, PA-C Physician Assistant [facility] [email or web address]   Shortness of Breath, Adult Shortness of breath is when a person has trouble breathing enough air or when a person feels like she or he is having trouble breathing in enough air. Shortness of breath could be a sign of a medical problem. Follow these instructions at home:   Pay attention to any changes in your symptoms.  Do not use any products that contain nicotine or tobacco, such as cigarettes, e-cigarettes, and chewing tobacco.  Do not smoke. Smoking is a common cause of shortness of breath. If you need help quitting, ask your health care provider.  Avoid things that can irritate your airways, such as: ? Mold. ? Dust. ? Air pollution. ? Chemical fumes. ? Things that can cause allergy symptoms (allergens), if you have allergies.  Keep your living space clean and free of mold and dust.  Rest as needed. Slowly return to your usual activities.  Take over-the-counter and prescription medicines only as told by your health care provider. This includes oxygen therapy and inhaled medicines.  Keep all follow-up visits as told by your health care provider. This is important. Contact a health care provider if:  Your condition does not improve as soon as expected.  You have a hard time doing your normal activities, even after you rest.  You have new symptoms. Get help right away if:  Your shortness of breath gets worse.  You have shortness of breath when you are resting.  You feel light-headed or  you faint.  You have a cough that is not controlled with medicines.  You cough up blood.  You have pain with breathing.  You have pain in your chest, arms, shoulders, or abdomen.  You have a fever.  You cannot walk up stairs or exercise the way that you normally do. These symptoms may represent a serious problem that is an emergency. Do not wait to see if the symptoms will go away. Get medical help right away. Call your local emergency services (911 in the [place] Do not drive yourself to the hospital. Summary  Shortness of breath is when a person has trouble breathing enough air. It can be a sign of a medical problem.  Avoid things that irritate your lungs, such as smoking, pollution, mold, and dust.  Pay attention to changes in your symptoms and contact your health care provider if you have a hard time completing daily activities because of shortness of breath. This information is not intended to replace advice given to you by your health care provider. Make sure you discuss any questions you have with your health care provider. Document Revised: 09/22/2017 Document Reviewed: 09/22/2017 Elsevier Patient Education  2020 [employer].

## 2019-12-22 ENCOUNTER — Ambulatory Visit: Payer: 59

## 2023-05-29 ENCOUNTER — Encounter: Payer: Self-pay | Admitting: Podiatry

## 2023-05-29 ENCOUNTER — Ambulatory Visit: Payer: 59 | Admitting: Podiatry

## 2023-05-29 VITALS — Ht 71.0 in | Wt 225.0 lb

## 2023-05-29 DIAGNOSIS — B351 Tinea unguium: Secondary | ICD-10-CM | POA: Diagnosis not present

## 2023-05-29 MED ORDER — TERBINAFINE HCL 250 MG PO TABS: 250.0000 mg | ORAL_TABLET | Freq: Every day | ORAL | 0 refills | Status: AC

## 2023-06-01 NOTE — Progress Notes (Signed)
  Subjective:  Patient ID: Alan Daugherty, male    DOB: 1980/09/04,  MRN: [id number]  Chief Complaint  Patient presents with   Nail Problem    He repots he has noticed some discoloration and thickness to the big toes nails and they are hard to cut, He has used OTC creams with no help.    43 y.o. male presents with the above complaint. History confirmed with patient.   Objective:  Physical Exam: warm, good capillary refill, no trophic changes or ulcerative lesions, normal DP and PT pulses, normal sensory exam, and onychomycosis.       Assessment:  No diagnosis found.   Plan:  Patient was evaluated and treated and all questions answered.  Onychomycosis -Educated on etiology of nail fungus. -Discussed oral topical and laser therapy and risks and benefits of each -eRx for oral terbinafine #90. Educated on risks and benefits of the medication. -Photographs taken   Return in about 3 months (around 08/27/2023) for follow up after nail fungus treatment.

## 2023-06-04 ENCOUNTER — Ambulatory Visit (HOSPITAL_COMMUNITY)
Admission: EM | Admit: 2023-06-04 | Discharge: 2023-06-04 | Disposition: A | Discharge status: Home / Self Care | Payer: 59 | Attending: Family Medicine | Admitting: Family Medicine

## 2023-06-04 ENCOUNTER — Encounter (HOSPITAL_COMMUNITY): Payer: Self-pay | Admitting: Emergency Medicine

## 2023-06-04 ENCOUNTER — Other Ambulatory Visit: Payer: Self-pay

## 2023-06-04 DIAGNOSIS — J101 Influenza due to other identified influenza virus with other respiratory manifestations: Secondary | ICD-10-CM | POA: Diagnosis not present

## 2023-06-04 LAB — POC COVID19/FLU A&B COMBO
Covid Antigen, POC: NEGATIVE
Influenza A Antigen, POC: POSITIVE — AB
Influenza B Antigen, POC: NEGATIVE

## 2023-06-04 MED ORDER — HYDROCODONE BIT-HOMATROP MBR 5-1.5 MG/5ML PO SOLN: 5.0000 mL | Freq: Four times a day (QID) | ORAL | 0 refills | Status: AC | PRN

## 2023-06-04 MED ORDER — OSELTAMIVIR PHOSPHATE 75 MG PO CAPS: 75.0000 mg | ORAL_CAPSULE | Freq: Two times a day (BID) | ORAL | 0 refills | Status: AC

## 2023-06-04 NOTE — ED Triage Notes (Signed)
Symptoms started last night while at work.  General chills, headache, cough.    This morning, once he got home, he noticed chills and general body aches with cough.    A lot of coworkers sick  Took theraflu and tylenol for symptoms

## 2023-06-04 NOTE — Discharge Instructions (Addendum)
Be aware, your cough medication may cause drowsiness. Please do not drive, operate heavy machinery or make important decisions while on this medication, it can cloud your judgement.

## 2023-06-05 NOTE — ED Provider Notes (Signed)
[facility]   [id number] 06/04/23 Arrival Time: 1659  ASSESSMENT & PLAN:  1. Influenza A    Discussed typical duration of likely viral illness. Results for orders placed or performed during the hospital encounter of 06/04/23  POC Covid19/Flu A&B Antigen   Collection Time: 06/04/23  7:17 PM  Result Value Ref Range   Influenza A Antigen, POC Positive (A) Negative   Influenza B Antigen, POC Negative Negative   Covid Antigen, POC Negative Negative   OTC symptom care as needed.  Meds ordered this encounter  Medications   HYDROcodone bit-homatropine (HYCODAN) 5-1.5 MG/5ML syrup    Sig: Take 5 mLs by mouth every 6 (six) hours as needed for cough.    Dispense:  90 mL    Refill:  0   oseltamivir (TAMIFLU) 75 MG capsule    Sig: Take 1 capsule (75 mg total) by mouth 2 (two) times daily for 5 days.    Dispense:  10 capsule    Refill:  0     Follow-up Information     [facility].   Specialty: Urgent Care Why: If worsening or failing to improve as anticipated. Contact information: [address] [place] [address] [phone]                Reviewed expectations re: course of current medical issues. Questions answered. Outlined signs and symptoms indicating need for more acute intervention. Understanding verbalized. After Visit Summary given.   SUBJECTIVE: History from: Patient. Alan Daugherty is a 43 y.o. male. Symptoms started last night while at work.  General chills, headache, cough.    This morning, once he got home, he noticed chills and general body aches with cough.    A lot of coworkers sick  Took theraflu and tylenol for symptoms Denies: difficulty breathing. Normal PO intake without n/v/d.  OBJECTIVE:  Vitals:   06/04/23 1913  BP: 126/81  Pulse: (!) 107  Resp: 18  Temp: 98.4 F (36.9 C)  TempSrc: Oral  SpO2: 97%    General appearance: alert; no distress Eyes: PERRLA; EOMI;  conjunctiva normal HENT: [place]; AT; with nasal congestion Neck: supple  Lungs: speaks full sentences without difficulty; unlabored; CTAB; coughing Extremities: no edema Skin: warm and dry Neurologic: normal gait Psychological: alert and cooperative; normal mood and affect  Labs: Results for orders placed or performed during the hospital encounter of 06/04/23  POC Covid19/Flu A&B Antigen   Collection Time: 06/04/23  7:17 PM  Result Value Ref Range   Influenza A Antigen, POC Positive (A) Negative   Influenza B Antigen, POC Negative Negative   Covid Antigen, POC Negative Negative   Labs Reviewed  POC COVID19/FLU A&B COMBO - Abnormal; Notable for the following components:      Result Value   Influenza A Antigen, POC Positive (*)    All other components within normal limits    Imaging: No results found.  No Known Allergies  History reviewed. No pertinent past medical history. Social History   Socioeconomic History   Marital status: Married    Spouse name: Not on file   Number of children: Not on file   Years of education: Not on file   Highest education level: Not on file  Occupational History   Not on file  Tobacco Use   Smoking status: Never   Smokeless tobacco: Never  Vaping Use   Vaping status: Never Used  Substance and Sexual Activity   Alcohol use: Yes  Comment: Occasional    Drug use: No   Sexual activity: Not on file  Other Topics Concern   Not on file  Social History Narrative   Not on file   Social Drivers of Health   Financial Resource Strain: Not on file  Food Insecurity: Not on file  Transportation Needs: Not on file  Physical Activity: Not on file  Stress: Not on file  Social Connections: Not on file  Intimate Partner Violence: Not on file   Family History  Problem Relation Age of Onset   Healthy Mother    Healthy Father    History reviewed. No pertinent surgical history.   Mardella Layman, MD 06/05/23 [phone]

## 2023-08-28 ENCOUNTER — Encounter: Payer: Self-pay | Admitting: Podiatry

## 2023-08-28 ENCOUNTER — Ambulatory Visit: Payer: 59 | Admitting: Podiatry

## 2023-08-28 VITALS — Ht 71.0 in | Wt 225.0 lb

## 2023-08-28 DIAGNOSIS — B351 Tinea unguium: Secondary | ICD-10-CM | POA: Diagnosis not present

## 2023-08-28 MED ORDER — TERBINAFINE HCL 250 MG PO TABS: 250.0000 mg | ORAL_TABLET | Freq: Every day | ORAL | 2 refills | Status: AC

## 2023-08-29 ENCOUNTER — Encounter: Payer: Self-pay | Admitting: Podiatry

## 2023-08-29 NOTE — Progress Notes (Signed)
  Subjective:  Patient ID: Alan Daugherty, male    DOB: [birth year],  MRN: [id number]  Chief Complaint  Patient presents with   Nail Problem    Pt is here to f/u on bilateral great toenail due to fungus, pt states he is seeing some results and that he only had 2 refills of the medication and ran out as of 4/1    43 y.o. male presents with the above complaint. History confirmed with patient.  He says he only took 60 days worth of the antibiotic as the pharmacy would not refill it  Objective:  Physical Exam: warm, good capillary refill, no trophic changes or ulcerative lesions, normal DP and PT pulses, normal sensory exam, and onychomycosis.         Assessment:   1. Onychomycosis      Plan:  Patient was evaluated and treated and all questions answered.  Onychomycosis - I do see him improvement proximally and recommend continued antifungal therapy.  Refill of Lamisil  90-day course sent to pharmacy.  They should refill the entire course that he has 90 days worth.  I discussed with him if there is no improvement next visit we should plan for a temporary nail avulsion and further antifungal treatment.  No follow-ups on file.

## 2023-11-25 ENCOUNTER — Telehealth: Payer: Self-pay | Admitting: Podiatry

## 2023-11-25 NOTE — Telephone Encounter (Signed)
 Called patient to verify he was wanting to cancel appointment. Unable to leave message

## 2023-11-27 ENCOUNTER — Ambulatory Visit: Admitting: Podiatry
